# Patient Record
Sex: Female | Born: 1960 | Hispanic: Yes | Marital: Married | Smoking: Never smoker
Health system: Southern US, Community
[De-identification: ages and names within clinical notes are randomized; demographics above are authoritative.]

## PROBLEM LIST (undated history)

## (undated) DIAGNOSIS — E119 Type 2 diabetes mellitus without complications: Secondary | ICD-10-CM

## (undated) HISTORY — PX: DILATION AND CURETTAGE OF UTERUS: SHX78

---

## 2017-09-02 ENCOUNTER — Other Ambulatory Visit: Payer: Self-pay | Admitting: Obstetrics and Gynecology

## 2017-09-02 DIAGNOSIS — Z1231 Encounter for screening mammogram for malignant neoplasm of breast: Secondary | ICD-10-CM

## 2017-12-02 ENCOUNTER — Encounter (HOSPITAL_COMMUNITY): Payer: Self-pay

## 2017-12-02 ENCOUNTER — Ambulatory Visit (HOSPITAL_COMMUNITY)
Admission: RE | Admit: 2017-12-02 | Discharge: 2017-12-02 | Disposition: A | Discharge status: Home / Self Care | Payer: Self-pay | Source: Ambulatory Visit | Attending: Obstetrics and Gynecology | Admitting: Obstetrics and Gynecology

## 2017-12-02 ENCOUNTER — Ambulatory Visit
Admission: RE | Admit: 2017-12-02 | Discharge: 2017-12-02 | Disposition: A | Discharge status: Home / Self Care | Payer: Self-pay | Source: Ambulatory Visit | Attending: Obstetrics and Gynecology | Admitting: Obstetrics and Gynecology

## 2017-12-02 VITALS — BP 126/80 | Wt 194.0 lb

## 2017-12-02 DIAGNOSIS — Z1231 Encounter for screening mammogram for malignant neoplasm of breast: Secondary | ICD-10-CM

## 2017-12-02 DIAGNOSIS — Z01419 Encounter for gynecological examination (general) (routine) without abnormal findings: Secondary | ICD-10-CM

## 2017-12-02 HISTORY — DX: Type 2 diabetes mellitus without complications: E11.9

## 2017-12-02 NOTE — Progress Notes (Signed)
No complaints today.   Pap Smear: Pap smear completed today. [place] Pap [place] was 4 [place] and normal per patient. Per patient has no history of an abnormal Pap smear. No Pap smear results are in Epic.  Physical exam: Breasts Breasts symmetrical. No skin abnormalities bilateral breasts. Slight bilateral nipple inversion that is greater within the left breast that per patient is normal for her. No nipple discharge bilateral breasts. No lymphadenopathy. No lumps palpated bilateral breasts. No complaints of pain or tenderness on exam. [facility] for [facility] screening mamm[facility]. Appointment scheduled for Thursday, December 02, 2017 at 0940.        Pelvic/Bimanual   Ext Genitalia No lesions, no swelling and no discharge observed on external genitalia.         Vagina Vagina pink and normal texture. No lesions or discharge observed in vagina.          Cervix Cervix is present. Cervix pink and of normal texture. No discharge observed.     Uterus Uterus is present and palpable. Uterus in normal position and normal size.        Adnexae Bilateral ovaries present and palpable. No tenderness on palpation.         Rectovaginal No rectal exam completed today since patient had no rectal complaints. No skin abnormalities observed on exam.    Smoking History: Patient has never smoked.  Patient Navigation: Patient education provided. Access to services provided for [facility] through [facility]. Spanish interpreter provided.   Colorectal Cancer Screening: Per patient has never had a colonoscopy completed. No complaints today. FIT Test given to patient to complete and return to [facility].  Breast and Cervical Cancer Risk Assessment: Patient has no family history of breast cancer, known genetic mutations, or radiation treatment to the chest before age 57. Patient has no history of cervical dysplasia, immunocompromised, or DES exposure  in-utero.  Risk Assessment    Risk Scores      12/02/2017   Last edited by: Lynnell DikeHolland, Sabrina H, LPN   5-year risk: 0.6 %   Lifetime risk: 4.1 %         Used Spanish interpreter Natale LayErika McReynolds from [employer].

## 2017-12-02 NOTE — Patient Instructions (Signed)
Explained breast self awareness with [name]. Let patient know [facility] will cover Pap smears and HPV typing every 5 years unless has a history of abnormal Pap smears. [facility] mamm[facility]. Appointment scheduled for Thursday, December 02, 2017 at 0940. Patient aware of appointment and will be there. Let patient know will follow up with her within the next couple weeks with results of Pap smear by letter or phone. Informed patient that the Breast Center will follow-up with her within the next couple of weeks with results of mammogram by letter or phone. Shari Ellis verbalized understanding.  [name], Kathaleen Maserhristine Poll, RN 10:26 AM

## 2017-12-03 LAB — CYTOLOGY - PAP
DIAGNOSIS: NEGATIVE
HPV (WINDOPATH): NOT DETECTED

## 2017-12-10 ENCOUNTER — Other Ambulatory Visit: Payer: Self-pay

## 2017-12-13 ENCOUNTER — Encounter (HOSPITAL_COMMUNITY): Payer: Self-pay | Admitting: *Deleted

## 2017-12-13 LAB — FECAL OCCULT BLOOD, IMMUNOCHEMICAL: FECAL OCCULT BLD: NEGATIVE

## 2017-12-13 LAB — SPECIMEN STATUS REPORT

## 2017-12-13 NOTE — Progress Notes (Signed)
Letter mailed to patient with negative pap smear results. HPV was negative. Next pap smear due in five years. 

## 2017-12-27 ENCOUNTER — Encounter (HOSPITAL_COMMUNITY): Payer: Self-pay

## 2019-09-06 IMAGING — MG DIGITAL SCREENING BILATERAL MAMMOGRAM WITH TOMO AND CAD
8 series · 8 of 24 positions shown · non-contrast
Comparison: Previous exam(s).

CLINICAL DATA: Screening.

EXAM:
DIGITAL SCREENING BILATERAL MAMMOGRAM WITH TOMO AND CAD

[R CC synth-2D]
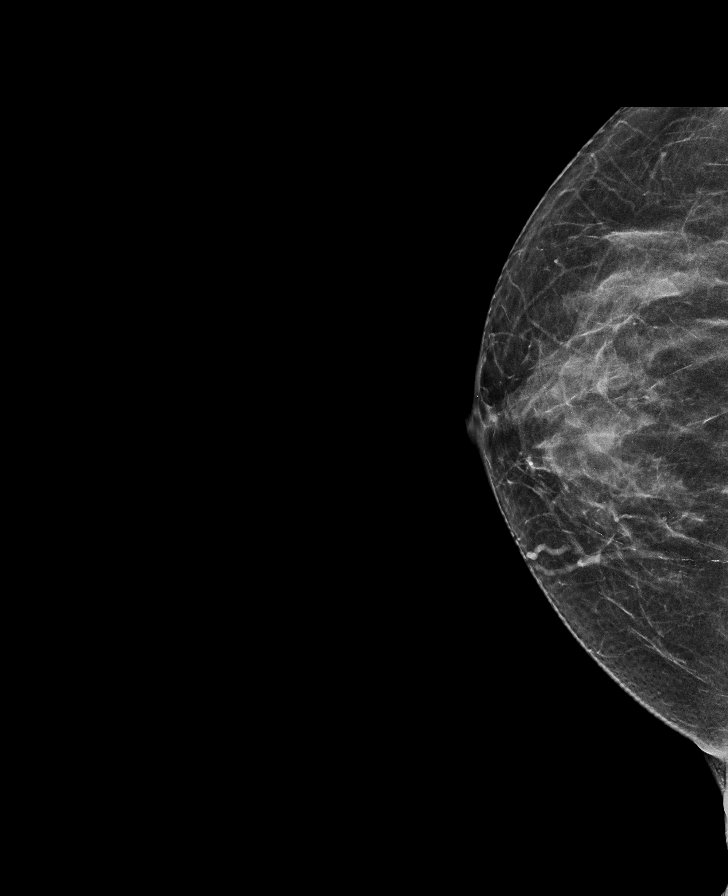

[L CC synth-2D]
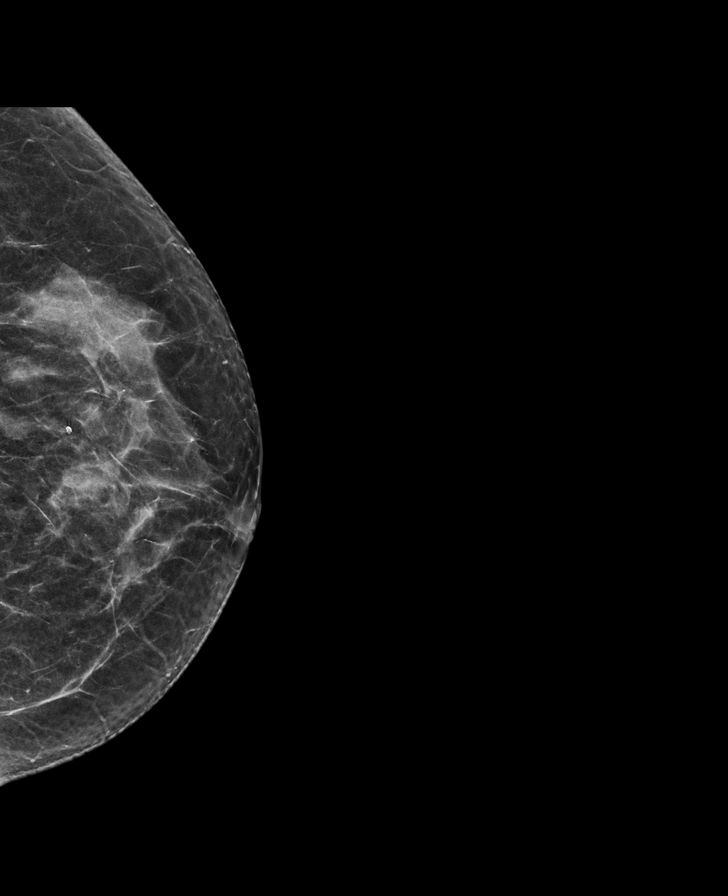

[R MLO synth-2D]
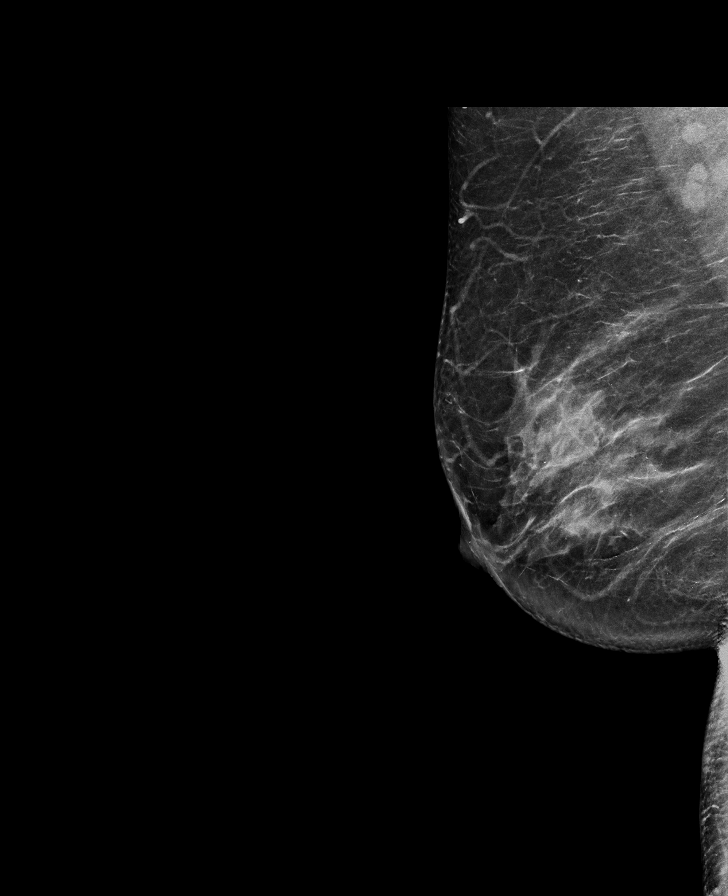

[L MLO synth-2D]
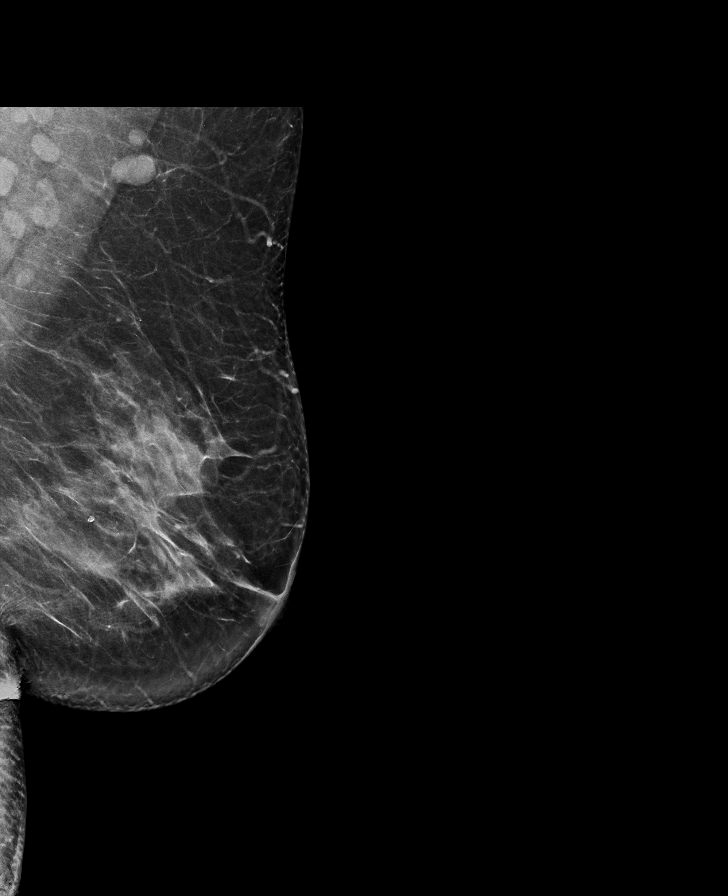

[L CC tomo · tomo slice 31/62.0]
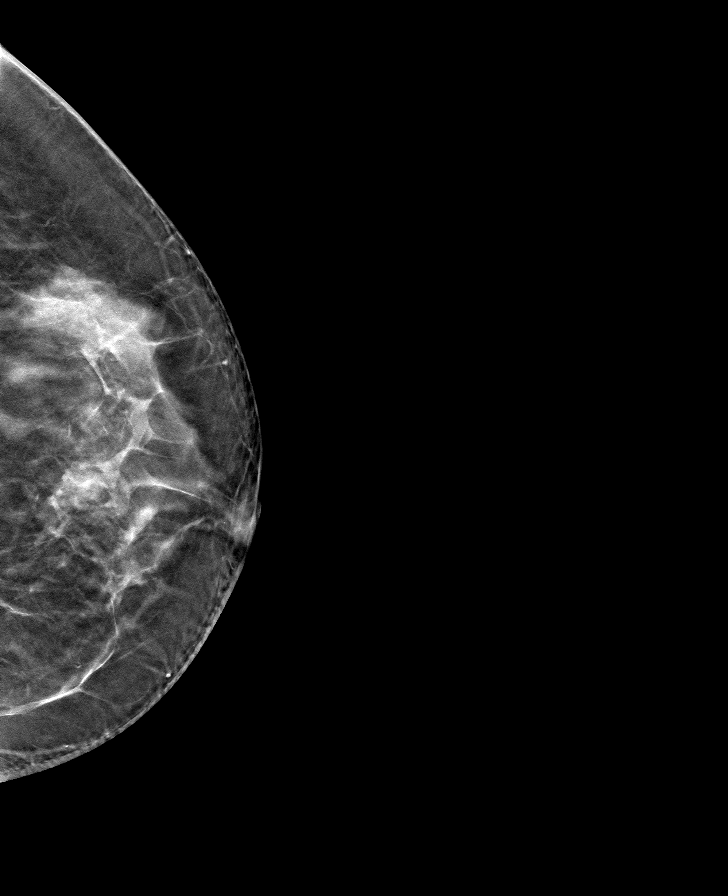

[R CC tomo · tomo slice 30/59.0]
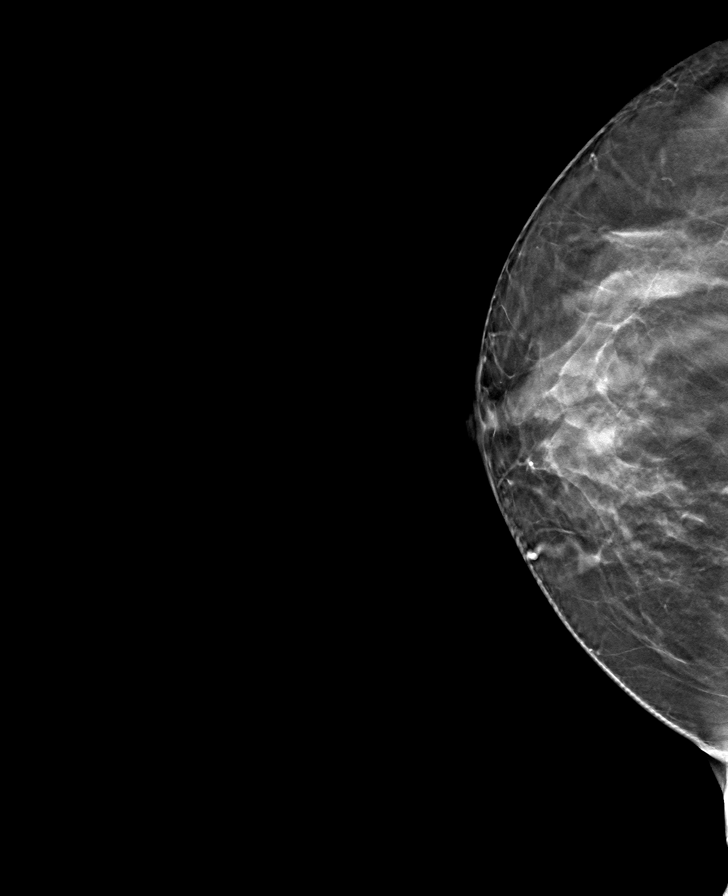

[L MLO tomo · tomo slice 41/80.0]
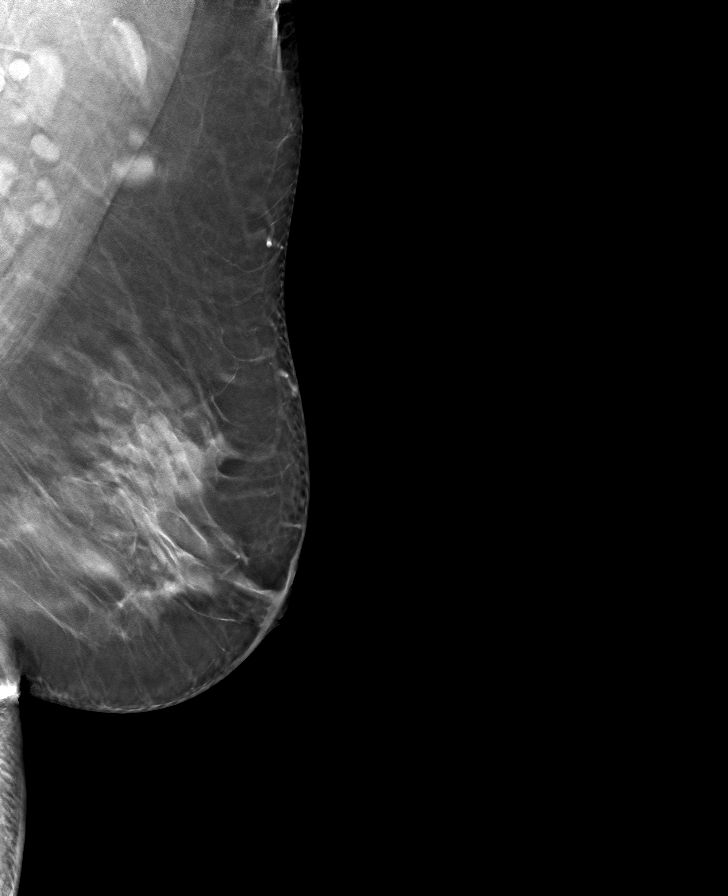

[R MLO tomo · tomo slice 37/72.0]
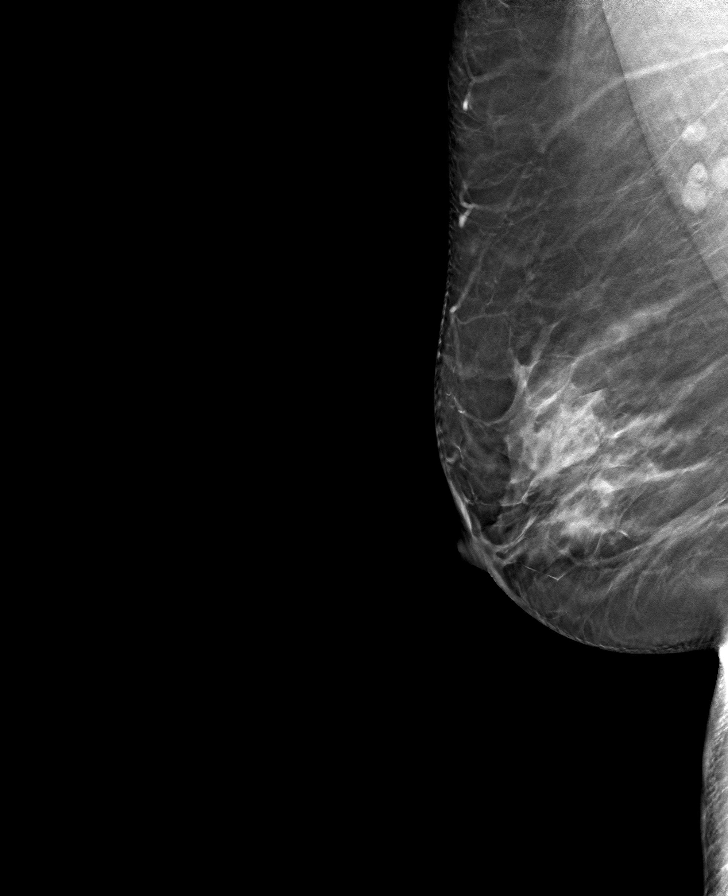

[8 of 24 positions shown; findings below may reference images not displayed]

ACR Breast Density Category c: The breast tissue is heterogeneously
dense, which may obscure small masses.
FINDINGS: There are no findings suspicious for malignancy. Images were
processed with CAD.
IMPRESSION: No mammographic evidence of malignancy. A result letter of this
screening mammogram will be mailed directly to the patient.

RECOMMENDATION:
Screening mammogram in one year. (Code:[id number])

BI-RADS CATEGORY  1: Negative.
